# Patient Record
Sex: Male | Born: 1990 | Race: White | Hispanic: No | Marital: Single
Health system: Southern US, Community
[De-identification: ages and names within clinical notes are randomized; demographics above are authoritative.]

---

## 2016-01-04 DIAGNOSIS — Z0000 Encounter for general adult medical examination without abnormal findings: Secondary | ICD-10-CM | POA: Diagnosis not present

## 2016-01-04 DIAGNOSIS — Z13220 Encounter for screening for lipoid disorders: Secondary | ICD-10-CM | POA: Diagnosis not present

## 2016-01-04 DIAGNOSIS — Z131 Encounter for screening for diabetes mellitus: Secondary | ICD-10-CM | POA: Diagnosis not present

## 2016-01-04 DIAGNOSIS — F909 Attention-deficit hyperactivity disorder, unspecified type: Secondary | ICD-10-CM | POA: Diagnosis not present

## 2016-06-01 DIAGNOSIS — Z23 Encounter for immunization: Secondary | ICD-10-CM | POA: Diagnosis not present

## 2016-07-03 DIAGNOSIS — K219 Gastro-esophageal reflux disease without esophagitis: Secondary | ICD-10-CM | POA: Diagnosis not present

## 2016-07-03 DIAGNOSIS — F909 Attention-deficit hyperactivity disorder, unspecified type: Secondary | ICD-10-CM | POA: Diagnosis not present

## 2016-12-18 DIAGNOSIS — F909 Attention-deficit hyperactivity disorder, unspecified type: Secondary | ICD-10-CM | POA: Diagnosis not present

## 2016-12-18 DIAGNOSIS — R1013 Epigastric pain: Secondary | ICD-10-CM | POA: Diagnosis not present

## 2016-12-18 DIAGNOSIS — R197 Diarrhea, unspecified: Secondary | ICD-10-CM | POA: Diagnosis not present

## 2016-12-18 DIAGNOSIS — K219 Gastro-esophageal reflux disease without esophagitis: Secondary | ICD-10-CM | POA: Diagnosis not present

## 2016-12-18 DIAGNOSIS — R110 Nausea: Secondary | ICD-10-CM | POA: Diagnosis not present

## 2017-01-23 DIAGNOSIS — G43909 Migraine, unspecified, not intractable, without status migrainosus: Secondary | ICD-10-CM | POA: Diagnosis not present

## 2017-03-16 DIAGNOSIS — F458 Other somatoform disorders: Secondary | ICD-10-CM | POA: Diagnosis not present

## 2017-03-16 DIAGNOSIS — K219 Gastro-esophageal reflux disease without esophagitis: Secondary | ICD-10-CM | POA: Diagnosis not present

## 2017-04-12 DIAGNOSIS — F411 Generalized anxiety disorder: Secondary | ICD-10-CM | POA: Diagnosis not present

## 2017-04-12 DIAGNOSIS — F909 Attention-deficit hyperactivity disorder, unspecified type: Secondary | ICD-10-CM | POA: Diagnosis not present

## 2017-04-12 DIAGNOSIS — R5383 Other fatigue: Secondary | ICD-10-CM | POA: Diagnosis not present

## 2017-04-30 DIAGNOSIS — K219 Gastro-esophageal reflux disease without esophagitis: Secondary | ICD-10-CM | POA: Diagnosis not present

## 2017-04-30 DIAGNOSIS — R1310 Dysphagia, unspecified: Secondary | ICD-10-CM | POA: Diagnosis not present

## 2017-04-30 DIAGNOSIS — F458 Other somatoform disorders: Secondary | ICD-10-CM | POA: Diagnosis not present

## 2017-05-01 ENCOUNTER — Other Ambulatory Visit: Payer: Self-pay | Admitting: Gastroenterology

## 2017-05-01 DIAGNOSIS — R1319 Other dysphagia: Secondary | ICD-10-CM

## 2017-05-01 DIAGNOSIS — R1310 Dysphagia, unspecified: Secondary | ICD-10-CM

## 2017-05-07 ENCOUNTER — Other Ambulatory Visit: Payer: Self-pay

## 2017-05-11 ENCOUNTER — Ambulatory Visit
Admission: RE | Admit: 2017-05-11 | Discharge: 2017-05-11 | Disposition: A | Discharge status: Home / Self Care | Payer: BLUE CROSS/BLUE SHIELD | Source: Ambulatory Visit | Attending: Gastroenterology | Admitting: Gastroenterology

## 2017-05-11 DIAGNOSIS — R1310 Dysphagia, unspecified: Secondary | ICD-10-CM

## 2017-05-11 DIAGNOSIS — R1319 Other dysphagia: Secondary | ICD-10-CM

## 2017-05-16 DIAGNOSIS — G43509 Persistent migraine aura without cerebral infarction, not intractable, without status migrainosus: Secondary | ICD-10-CM | POA: Diagnosis not present

## 2017-05-30 DIAGNOSIS — K2930 Chronic superficial gastritis without bleeding: Secondary | ICD-10-CM | POA: Diagnosis not present

## 2017-05-30 DIAGNOSIS — R1310 Dysphagia, unspecified: Secondary | ICD-10-CM | POA: Diagnosis not present

## 2017-05-30 DIAGNOSIS — K219 Gastro-esophageal reflux disease without esophagitis: Secondary | ICD-10-CM | POA: Diagnosis not present

## 2017-05-30 DIAGNOSIS — K210 Gastro-esophageal reflux disease with esophagitis: Secondary | ICD-10-CM | POA: Diagnosis not present

## 2017-06-07 DIAGNOSIS — K2930 Chronic superficial gastritis without bleeding: Secondary | ICD-10-CM | POA: Diagnosis not present

## 2017-06-07 DIAGNOSIS — K210 Gastro-esophageal reflux disease with esophagitis: Secondary | ICD-10-CM | POA: Diagnosis not present

## 2017-11-09 DIAGNOSIS — Z23 Encounter for immunization: Secondary | ICD-10-CM | POA: Diagnosis not present

## 2017-11-09 DIAGNOSIS — Z0000 Encounter for general adult medical examination without abnormal findings: Secondary | ICD-10-CM | POA: Diagnosis not present

## 2017-11-26 DIAGNOSIS — R140 Abdominal distension (gaseous): Secondary | ICD-10-CM | POA: Diagnosis not present

## 2017-11-26 DIAGNOSIS — R142 Eructation: Secondary | ICD-10-CM | POA: Diagnosis not present

## 2017-11-26 DIAGNOSIS — K220 Achalasia of cardia: Secondary | ICD-10-CM | POA: Diagnosis not present

## 2017-11-26 DIAGNOSIS — J383 Other diseases of vocal cords: Secondary | ICD-10-CM | POA: Diagnosis not present

## 2017-11-28 DIAGNOSIS — R140 Abdominal distension (gaseous): Secondary | ICD-10-CM | POA: Diagnosis not present

## 2017-11-28 DIAGNOSIS — I871 Compression of vein: Secondary | ICD-10-CM | POA: Diagnosis not present

## 2017-11-28 DIAGNOSIS — J392 Other diseases of pharynx: Secondary | ICD-10-CM | POA: Diagnosis not present

## 2017-11-28 DIAGNOSIS — R142 Eructation: Secondary | ICD-10-CM | POA: Diagnosis not present

## 2018-04-12 DIAGNOSIS — F411 Generalized anxiety disorder: Secondary | ICD-10-CM | POA: Diagnosis not present

## 2018-04-12 DIAGNOSIS — F909 Attention-deficit hyperactivity disorder, unspecified type: Secondary | ICD-10-CM | POA: Diagnosis not present

## 2019-01-24 DIAGNOSIS — K219 Gastro-esophageal reflux disease without esophagitis: Secondary | ICD-10-CM | POA: Diagnosis not present

## 2019-01-24 DIAGNOSIS — F909 Attention-deficit hyperactivity disorder, unspecified type: Secondary | ICD-10-CM | POA: Diagnosis not present

## 2019-09-26 DIAGNOSIS — F411 Generalized anxiety disorder: Secondary | ICD-10-CM | POA: Diagnosis not present

## 2019-10-16 ENCOUNTER — Ambulatory Visit: Payer: BC Managed Care – PPO | Attending: Internal Medicine

## 2019-10-16 DIAGNOSIS — Z23 Encounter for immunization: Secondary | ICD-10-CM

## 2019-10-16 NOTE — Progress Notes (Signed)
   Covid-[facility] Vaccination [facility]  Name:  DAVONTAE PRUSINSKI    MRN: [id number] DOB: [birth year]  10/16/2019  Mr. Schippers was observed post Covid-19 immunization for 15 minutes without incident. He was provided with Vaccine Information Sheet and instruction to access the V-Safe system.   Mr. Hicklin was instructed to call 911 with any severe reactions post vaccine: Marland Kitchen Difficulty breathing  . Swelling of face and throat  . A fast heartbeat  . A bad rash all over body  . Dizziness and weakness   Immunizations Administered    Name Date Dose VIS Date Route   Pfizer COVID-19 Vaccine 10/16/2019 11:10 AM 0.3 mL 07/18/2019 Intramuscular   Manufacturer: [employer], [employer]   Lot: [id number]   NDC: [address]-0

## 2019-11-10 ENCOUNTER — Ambulatory Visit: Payer: BC Managed Care – PPO | Attending: Internal Medicine

## 2019-11-10 DIAGNOSIS — Z23 Encounter for immunization: Secondary | ICD-10-CM

## 2019-11-10 NOTE — Progress Notes (Signed)
   Covid-19 Vaccination Clinic  Name:  HEROLD SALGUERO    MRN: [id number] DOB: 19-Aug-1990  11/10/2019  Mr. Iddings was observed post Covid-19 immunization for 15 minutes without incident. He was provided with Vaccine Information Sheet and instruction to access the V-Safe system.   Mr. Rhett was instructed to call 911 with any severe reactions post vaccine: Marland Kitchen Difficulty breathing  . Swelling of face and throat  . A fast heartbeat  . A bad rash all over body  . Dizziness and weakness   Immunizations Administered    Name Date Dose VIS Date Route   Pfizer COVID-19 Vaccine 11/10/2019  9:39 AM 0.3 mL 07/18/2019 Intramuscular   Manufacturer: [employer], [employer]   Lot: [id number]   NDC: [address]-8

## 2020-03-29 DIAGNOSIS — Z20822 Contact with and (suspected) exposure to covid-19: Secondary | ICD-10-CM | POA: Diagnosis not present

## 2020-05-11 DIAGNOSIS — Z23 Encounter for immunization: Secondary | ICD-10-CM | POA: Diagnosis not present

## 2020-05-11 DIAGNOSIS — H539 Unspecified visual disturbance: Secondary | ICD-10-CM | POA: Diagnosis not present

## 2020-05-18 DIAGNOSIS — G43419 Hemiplegic migraine, intractable, without status migrainosus: Secondary | ICD-10-CM | POA: Diagnosis not present

## 2020-05-18 DIAGNOSIS — G43119 Migraine with aura, intractable, without status migrainosus: Secondary | ICD-10-CM | POA: Diagnosis not present

## 2020-05-18 DIAGNOSIS — Z79899 Other long term (current) drug therapy: Secondary | ICD-10-CM | POA: Diagnosis not present

## 2020-05-18 DIAGNOSIS — G43519 Persistent migraine aura without cerebral infarction, intractable, without status migrainosus: Secondary | ICD-10-CM | POA: Diagnosis not present

## 2020-05-18 DIAGNOSIS — Z049 Encounter for examination and observation for unspecified reason: Secondary | ICD-10-CM | POA: Diagnosis not present

## 2020-05-19 ENCOUNTER — Other Ambulatory Visit: Payer: Self-pay | Admitting: Specialist

## 2020-05-19 DIAGNOSIS — R531 Weakness: Secondary | ICD-10-CM

## 2020-05-19 DIAGNOSIS — I639 Cerebral infarction, unspecified: Secondary | ICD-10-CM

## 2020-05-19 DIAGNOSIS — H539 Unspecified visual disturbance: Secondary | ICD-10-CM

## 2020-06-09 ENCOUNTER — Other Ambulatory Visit: Payer: Self-pay | Admitting: Specialist

## 2020-06-10 ENCOUNTER — Ambulatory Visit
Admission: RE | Admit: 2020-06-10 | Discharge: 2020-06-10 | Disposition: A | Discharge status: Home / Self Care | Payer: BC Managed Care – PPO | Source: Ambulatory Visit | Attending: Specialist | Admitting: Specialist

## 2020-06-10 ENCOUNTER — Other Ambulatory Visit: Payer: Self-pay

## 2020-06-10 DIAGNOSIS — G43909 Migraine, unspecified, not intractable, without status migrainosus: Secondary | ICD-10-CM | POA: Diagnosis not present

## 2020-06-10 DIAGNOSIS — H547 Unspecified visual loss: Secondary | ICD-10-CM | POA: Diagnosis not present

## 2020-06-10 DIAGNOSIS — R531 Weakness: Secondary | ICD-10-CM

## 2020-06-10 DIAGNOSIS — H538 Other visual disturbances: Secondary | ICD-10-CM | POA: Diagnosis not present

## 2020-06-10 DIAGNOSIS — H539 Unspecified visual disturbance: Secondary | ICD-10-CM

## 2020-06-10 DIAGNOSIS — I639 Cerebral infarction, unspecified: Secondary | ICD-10-CM

## 2020-06-10 MED ORDER — GADOBENATE DIMEGLUMINE 529 MG/ML IV SOLN
18.0000 mL | Freq: Once | INTRAVENOUS | Status: AC | PRN
  Administered 2020-06-10: 18 mL via INTRAVENOUS

## 2020-06-22 DIAGNOSIS — G43909 Migraine, unspecified, not intractable, without status migrainosus: Secondary | ICD-10-CM | POA: Diagnosis not present

## 2020-06-22 DIAGNOSIS — H5213 Myopia, bilateral: Secondary | ICD-10-CM | POA: Diagnosis not present

## 2020-07-26 DIAGNOSIS — Z20822 Contact with and (suspected) exposure to covid-19: Secondary | ICD-10-CM | POA: Diagnosis not present

## 2020-07-26 DIAGNOSIS — Z03818 Encounter for observation for suspected exposure to other biological agents ruled out: Secondary | ICD-10-CM | POA: Diagnosis not present

## 2021-05-10 DIAGNOSIS — K219 Gastro-esophageal reflux disease without esophagitis: Secondary | ICD-10-CM | POA: Diagnosis not present

## 2021-05-10 DIAGNOSIS — R140 Abdominal distension (gaseous): Secondary | ICD-10-CM | POA: Diagnosis not present

## 2021-05-10 DIAGNOSIS — R142 Eructation: Secondary | ICD-10-CM | POA: Diagnosis not present

## 2021-05-10 DIAGNOSIS — K220 Achalasia of cardia: Secondary | ICD-10-CM | POA: Diagnosis not present

## 2022-02-06 DIAGNOSIS — Z0000 Encounter for general adult medical examination without abnormal findings: Secondary | ICD-10-CM | POA: Diagnosis not present

## 2022-02-16 DIAGNOSIS — Z131 Encounter for screening for diabetes mellitus: Secondary | ICD-10-CM | POA: Diagnosis not present

## 2022-02-16 DIAGNOSIS — Z13220 Encounter for screening for lipoid disorders: Secondary | ICD-10-CM | POA: Diagnosis not present

## 2022-06-07 DIAGNOSIS — B354 Tinea corporis: Secondary | ICD-10-CM | POA: Diagnosis not present

## 2022-06-07 DIAGNOSIS — Z8669 Personal history of other diseases of the nervous system and sense organs: Secondary | ICD-10-CM | POA: Diagnosis not present

## 2022-06-07 DIAGNOSIS — Z23 Encounter for immunization: Secondary | ICD-10-CM | POA: Diagnosis not present

## 2022-09-07 IMAGING — MR MR HEAD WO/W CM
12 series · 48 of 48 positions shown · IV contrast (18ml multihance)
Comparison: None.

CLINICAL DATA: Migraine headaches with abnormal vision

EXAM:
MRI HEAD WITHOUT AND WITH CONTRAST
TECHNIQUE: Multiplanar, multiecho pulse sequences of the brain and surrounding
structures were obtained without and with intravenous contrast.
CONTRAST:  18mL MULTIHANCE GADOBENATE DIMEGLUMINE 529 MG/ML IV SOLN

[Series 5: T1 · sagittal · 4.0mm · 0.75mm/px · 1 of 31 slices shown (1 of 3)]
[im 1/31]
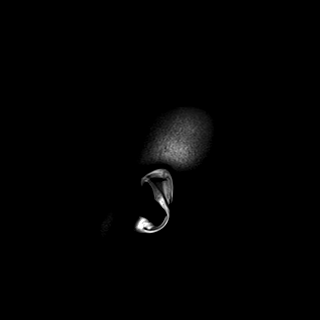

[Series 6: DWI · axial · 3.0mm · 1.44mm/px · z∈[-59,+76]mm · 4 of 84 slices shown (1 of 4)]
[im 1/84]
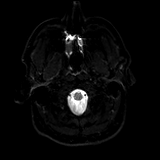
[im 28/84]
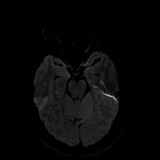
[im 56/84]
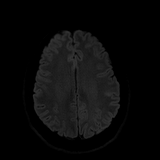
[im 84/84]
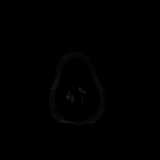

[Series 7: DWI · axial · 3.0mm · 1.44mm/px · z∈[-59,+76]mm · 3 of 42 slices shown (2 of 4)]
[im 1/42]
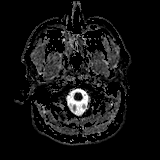
[im 21/42]
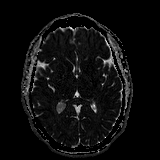
[im 42/42]
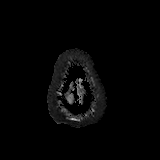

[Series 8: DWI · coronal · 5.0mm · 1.44mm/px · 4 of 60 slices shown (3 of 4)]
[im 1/60]
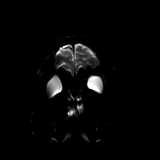
[im 20/60]
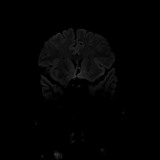
[im 40/60]
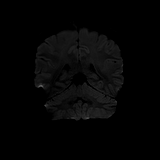
[im 60/60]
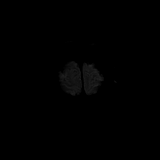

[Series 9: DWI · coronal · 5.0mm · 1.44mm/px · 2 of 30 slices shown (4 of 4)]
[im 1/30]
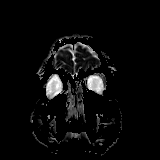
[im 30/30]
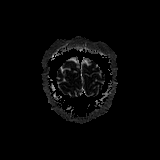

[Series 10: T2 · axial · 4.0mm · 0.36mm/px · z∈[-61,+74]mm · 2 of 27 slices shown]
[im 1/27]
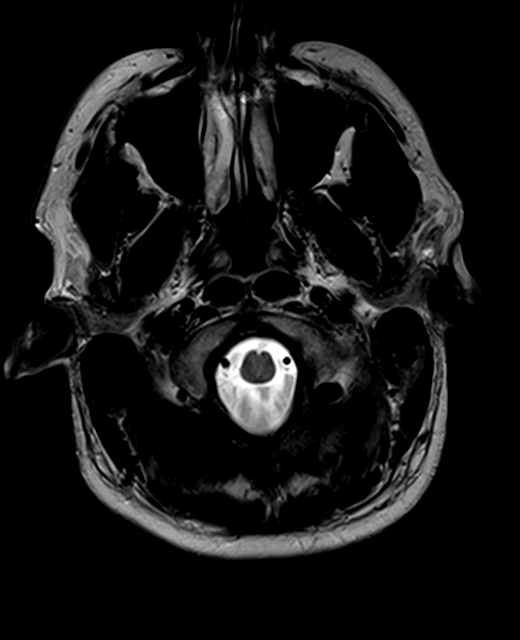
[im 27/27]
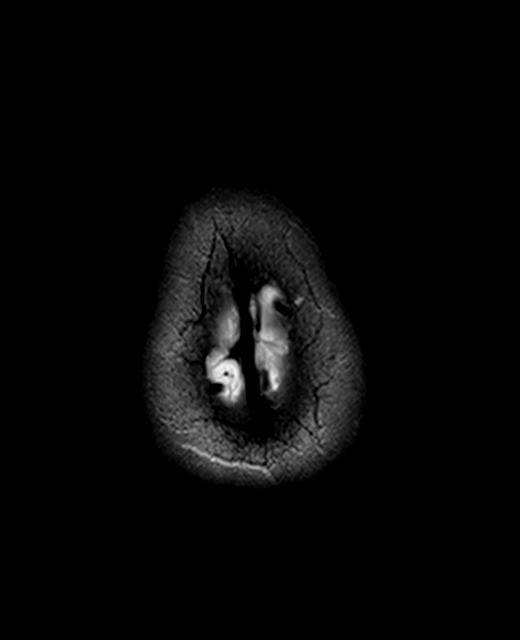

[Series 11: FLAIR · axial · 3.0mm · 0.72mm/px · z∈[-69,+81]mm · 2 of 26 slices shown]
[im 1/26]
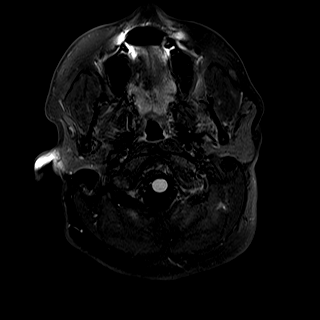
[im 26/26]
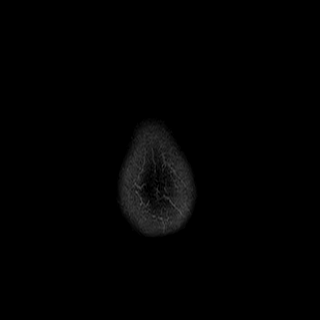

[Series 13: swi_images · axial · 1.5mm · 0.90mm/px · z∈[-65,+78]mm · 6 of 96 slices shown]
[im 1/96]
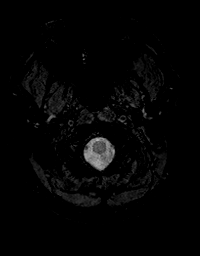
[im 20/96]
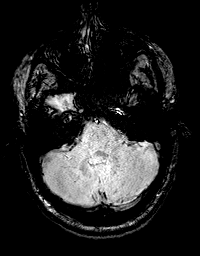
[im 39/96]
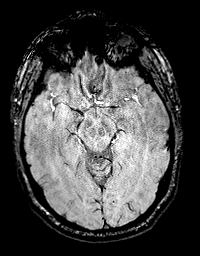
[im 58/96]
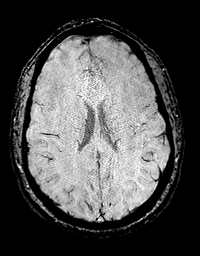
[im 77/96]
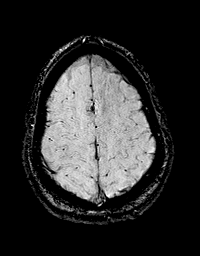
[im 96/96]
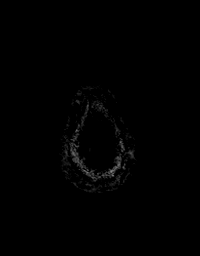

[Series 14: T1 · axial · 1.0mm · 0.94mm/px · z∈[-73,+86]mm · 10 of 160 slices shown (2 of 3)]
[im 1/160]
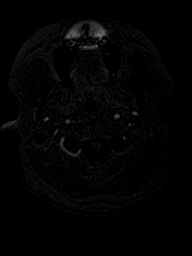
[im 18/160]
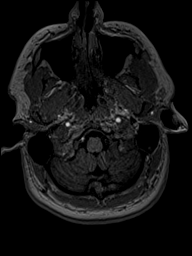
[im 36/160]
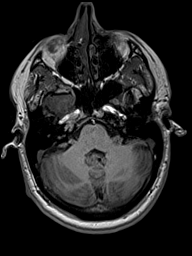
[im 54/160]
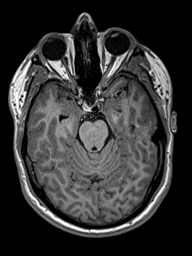
[im 71/160]
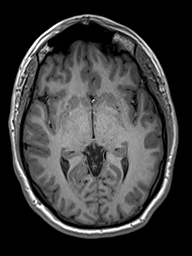
[im 89/160]
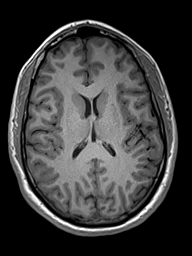
[im 107/160]
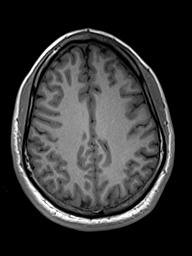
[im 124/160]
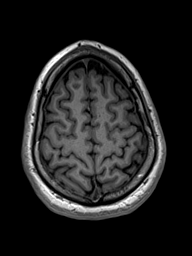
[im 142/160]
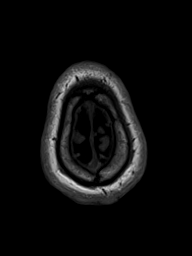
[im 160/160]
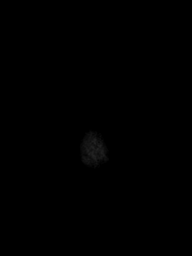

[Series 15: T2 post-contrast · coronal · 4.5mm · 0.36mm/px · 2 of 35 slices shown]
[im 1/35]
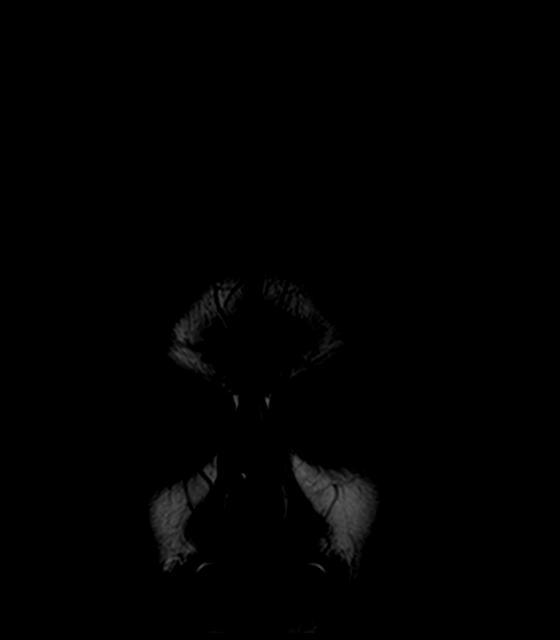
[im 35/35]
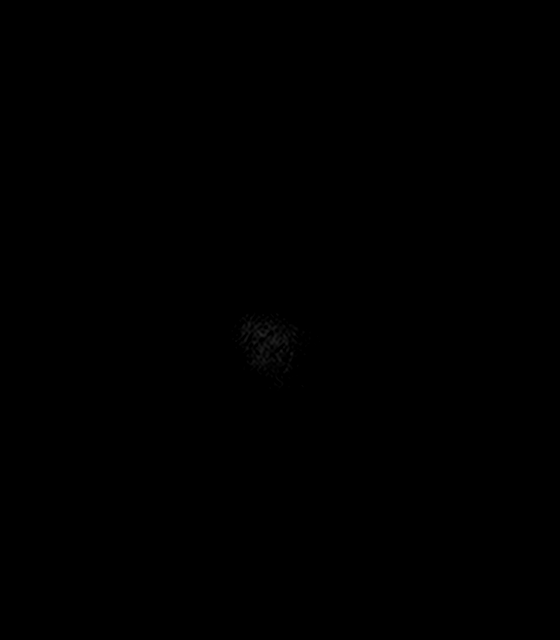

[Series 16: T1 · axial · 1.0mm · 0.94mm/px · z∈[-73,+86]mm · 10 of 160 slices shown (3 of 3)]
[im 1/160]
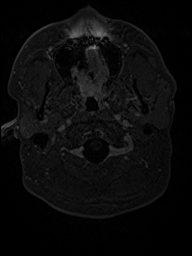
[im 18/160]
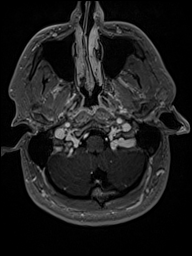
[im 36/160]
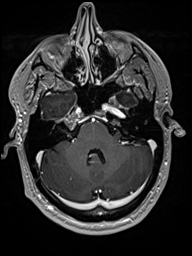
[im 54/160]
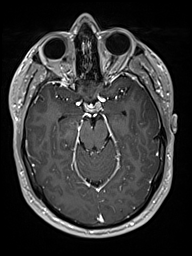
[im 71/160]
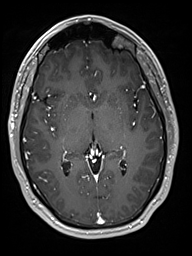
[im 89/160]
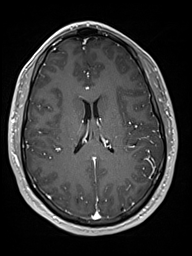
[im 107/160]
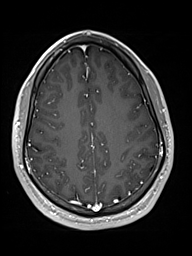
[im 124/160]
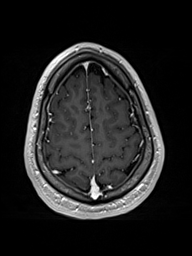
[im 142/160]
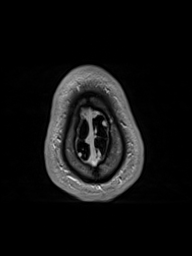
[im 160/160]
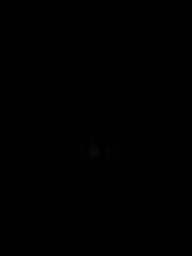

[Series 17: T1 post-contrast · coronal · 4.5mm · 0.72mm/px · 2 of 35 slices shown]
[im 1/35]
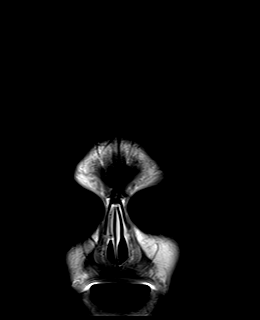
[im 35/35]
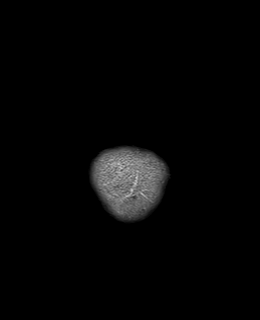

[48 of 48 positions shown; findings below may reference images not displayed]

FINDINGS: Brain: There is no acute infarction or intracranial hemorrhage.
There is no intracranial mass, mass effect, or edema. There is no
hydrocephalus or extra-axial fluid collection. Ventricles and sulci
are normal in size and configuration. No abnormal enhancement.

Vascular: Major vessel flow voids at the skull base are preserved.

Skull and upper cervical spine: Normal marrow signal is preserved.

Sinuses/Orbits: Paranasal sinuses are aerated. Orbits are
unremarkable.

Other: Sella is unremarkable.  Mastoid air cells are clear.
IMPRESSION: Normal MRI of the brain.

## 2022-09-08 DIAGNOSIS — F419 Anxiety disorder, unspecified: Secondary | ICD-10-CM | POA: Diagnosis not present

## 2022-09-08 DIAGNOSIS — K219 Gastro-esophageal reflux disease without esophagitis: Secondary | ICD-10-CM | POA: Diagnosis not present

## 2022-10-04 DIAGNOSIS — R1033 Periumbilical pain: Secondary | ICD-10-CM | POA: Diagnosis not present

## 2023-06-27 DIAGNOSIS — R110 Nausea: Secondary | ICD-10-CM | POA: Diagnosis not present

## 2023-06-27 DIAGNOSIS — J020 Streptococcal pharyngitis: Secondary | ICD-10-CM | POA: Diagnosis not present

## 2023-11-27 DIAGNOSIS — Z13220 Encounter for screening for lipoid disorders: Secondary | ICD-10-CM | POA: Diagnosis not present

## 2023-11-27 DIAGNOSIS — Z131 Encounter for screening for diabetes mellitus: Secondary | ICD-10-CM | POA: Diagnosis not present

## 2023-11-27 DIAGNOSIS — B353 Tinea pedis: Secondary | ICD-10-CM | POA: Diagnosis not present

## 2023-11-27 DIAGNOSIS — Z0000 Encounter for general adult medical examination without abnormal findings: Secondary | ICD-10-CM | POA: Diagnosis not present

## 2023-11-27 DIAGNOSIS — G43909 Migraine, unspecified, not intractable, without status migrainosus: Secondary | ICD-10-CM | POA: Diagnosis not present

## 2024-01-21 DIAGNOSIS — K219 Gastro-esophageal reflux disease without esophagitis: Secondary | ICD-10-CM | POA: Diagnosis not present

## 2024-01-21 DIAGNOSIS — K625 Hemorrhage of anus and rectum: Secondary | ICD-10-CM | POA: Diagnosis not present

## 2024-01-21 DIAGNOSIS — R194 Change in bowel habit: Secondary | ICD-10-CM | POA: Diagnosis not present

## 2024-01-21 DIAGNOSIS — Z83719 Family history of colon polyps, unspecified: Secondary | ICD-10-CM | POA: Diagnosis not present

## 2024-03-06 DIAGNOSIS — Z800 Family history of malignant neoplasm of digestive organs: Secondary | ICD-10-CM | POA: Diagnosis not present

## 2024-03-06 DIAGNOSIS — K5730 Diverticulosis of large intestine without perforation or abscess without bleeding: Secondary | ICD-10-CM | POA: Diagnosis not present

## 2024-03-06 DIAGNOSIS — K625 Hemorrhage of anus and rectum: Secondary | ICD-10-CM | POA: Diagnosis not present

## 2024-03-06 DIAGNOSIS — K640 First degree hemorrhoids: Secondary | ICD-10-CM | POA: Diagnosis not present

## 2024-03-06 DIAGNOSIS — R194 Change in bowel habit: Secondary | ICD-10-CM | POA: Diagnosis not present

## 2024-03-06 DIAGNOSIS — K635 Polyp of colon: Secondary | ICD-10-CM | POA: Diagnosis not present

## 2024-03-06 DIAGNOSIS — Z83719 Family history of colon polyps, unspecified: Secondary | ICD-10-CM | POA: Diagnosis not present

## 2024-03-06 DIAGNOSIS — K621 Rectal polyp: Secondary | ICD-10-CM | POA: Diagnosis not present

## 2024-03-06 DIAGNOSIS — D123 Benign neoplasm of transverse colon: Secondary | ICD-10-CM | POA: Diagnosis not present

## 2024-06-29 DIAGNOSIS — R350 Frequency of micturition: Secondary | ICD-10-CM | POA: Diagnosis not present
